# Patient Record
Sex: Female | Born: 1945 | Race: Black or African American | Hispanic: No | Marital: Married | Smoking: Never smoker
Health system: Southern US, Community
[De-identification: ages and names within clinical notes are randomized; demographics above are authoritative.]

## PROBLEM LIST (undated history)

## (undated) DIAGNOSIS — R339 Retention of urine, unspecified: Secondary | ICD-10-CM

## (undated) DIAGNOSIS — G8194 Hemiplegia, unspecified affecting left nondominant side: Secondary | ICD-10-CM

## (undated) DIAGNOSIS — I10 Essential (primary) hypertension: Secondary | ICD-10-CM

## (undated) DIAGNOSIS — I639 Cerebral infarction, unspecified: Secondary | ICD-10-CM

## (undated) DIAGNOSIS — E785 Hyperlipidemia, unspecified: Secondary | ICD-10-CM

## (undated) DIAGNOSIS — F329 Major depressive disorder, single episode, unspecified: Secondary | ICD-10-CM

## (undated) DIAGNOSIS — F32A Depression, unspecified: Secondary | ICD-10-CM

## (undated) HISTORY — DX: Essential (primary) hypertension: I10

## (undated) HISTORY — DX: Retention of urine, unspecified: R33.9

## (undated) HISTORY — DX: Cerebral infarction, unspecified: I63.9

## (undated) HISTORY — DX: Hyperlipidemia, unspecified: E78.5

## (undated) HISTORY — DX: Hemiplegia, unspecified affecting left nondominant side: G81.94

## (undated) HISTORY — DX: Major depressive disorder, single episode, unspecified: F32.9

## (undated) HISTORY — DX: Depression, unspecified: F32.A

---

## 2018-05-22 ENCOUNTER — Ambulatory Visit (INDEPENDENT_AMBULATORY_CARE_PROVIDER_SITE_OTHER): Payer: Medicare Other | Admitting: Neurology

## 2018-05-22 ENCOUNTER — Encounter: Payer: Self-pay | Admitting: Neurology

## 2018-05-22 ENCOUNTER — Telehealth: Payer: Self-pay

## 2018-05-22 VITALS — BP 150/95 | HR 72

## 2018-05-22 DIAGNOSIS — G8110 Spastic hemiplegia affecting unspecified side: Secondary | ICD-10-CM | POA: Diagnosis not present

## 2018-05-22 DIAGNOSIS — I63511 Cerebral infarction due to unspecified occlusion or stenosis of right middle cerebral artery: Secondary | ICD-10-CM | POA: Diagnosis not present

## 2018-05-22 NOTE — Telephone Encounter (Signed)
Request for medical release fax to [facility]  in [place] MD [phone]. THis was to medical records. DR.SEthi needs copy of disc of all scans. Needs all admission, and discharge notes, and labs and medications for him to review from [facility] in [place].

## 2018-05-22 NOTE — Patient Instructions (Signed)
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  Unfortunately I do not have records from [facility] in [place] today and we will send for these records before I decide on any further stroke work-up.  She will benefit with referral to neuro rehabilitation clinic for ongoing spasticity and therapy management.  Continue aspirin for stroke prevention and strict control of hypertension with blood pressure goal below 130/90 and lipids with LDL cholesterol goal below 70 mg percent.  Continue ongoing physical occupational and speech therapy.  She will return for follow-up in 2 months or call earlier if necessary.   Stroke Prevention Some medical conditions and behaviors are associated with a higher chance of having a stroke. You can help prevent a stroke by making nutrition, lifestyle, and other changes, including managing any medical conditions you may have. What nutrition changes can be made?  Eat healthy foods. You can do this by: ? Choosing foods high in fiber, such as fresh fruits and vegetables and whole grains. ? Eating at least 5 or more servings of fruits and vegetables a day. Try to fill half of your plate at each meal with fruits and vegetables. ? Choosing lean protein foods, such as lean cuts of meat, poultry without skin, fish, tofu, beans, and nuts. ? Eating low-fat dairy products. ? Avoiding foods that are high in salt (sodium). This can help lower blood pressure. ? Avoiding foods that have saturated fat, trans fat, and cholesterol. This can help prevent high cholesterol. ? Avoiding processed and premade foods.  Follow your health care provider's specific guidelines for losing weight, controlling high blood pressure (hypertension), lowering high cholesterol, and managing diabetes. These may include: ? Reducing your  daily calorie intake. ? Limiting your daily sodium intake to 1,500 milligrams (mg). ? Using only healthy fats for cooking, such as olive oil, canola oil, or sunflower oil. ? Counting your daily carbohydrate intake. What lifestyle changes can be made?  Maintain a healthy weight. Talk to your health care provider about your ideal weight.  Get at least 30 minutes of moderate physical activity at least 5 days a week. Moderate activity includes brisk walking, biking, and swimming.  Do not use any products that contain nicotine or tobacco, such as cigarettes and e-cigarettes. If you need help quitting, ask your health care provider. It may also be helpful to avoid exposure to secondhand smoke.  Limit alcohol intake to no more than 1 drink a day for nonpregnant women and 2 drinks a day for men. One drink equals 12 oz of beer, 5 oz of wine, or 1 oz of hard liquor.  Stop any illegal drug use.  Avoid taking birth control pills. Talk to your health care provider about the risks of taking birth control pills if: ? You are over 48 years old. ? You smoke. ? You get migraines. ? You have ever had a blood clot. What other changes can be made?  Manage your cholesterol levels. ? Eating a healthy diet is important for preventing high cholesterol. If cholesterol cannot be managed through diet alone, you may also need to take medicines. ? Take any prescribed medicines to control your cholesterol as told by your health care provider.  Manage your diabetes. ? Eating a healthy diet and exercising regularly are important parts of managing your blood sugar. If your blood  sugar cannot be managed through diet and exercise, you may need to take medicines. ? Take any prescribed medicines to control your diabetes as told by your health care provider.  Control your hypertension. ? To reduce your risk of stroke, try to keep your blood pressure below 130/80. ? Eating a healthy diet and exercising regularly are an  important part of controlling your blood pressure. If your blood pressure cannot be managed through diet and exercise, you may need to take medicines. ? Take any prescribed medicines to control hypertension as told by your health care provider. ? Ask your health care provider if you should monitor your blood pressure at home. ? Have your blood pressure checked every year, even if your blood pressure is normal. Blood pressure increases with age and some medical conditions.  Get evaluated for sleep disorders (sleep apnea). Talk to your health care provider about getting a sleep evaluation if you snore a lot or have excessive sleepiness.  Take over-the-counter and prescription medicines only as told by your health care provider. Aspirin or blood thinners (antiplatelets or anticoagulants) may be recommended to reduce your risk of forming blood clots that can lead to stroke.  Make sure that any other medical conditions you have, such as atrial fibrillation or atherosclerosis, are managed. What are the warning signs of a stroke? The warning signs of a stroke can be easily remembered as BEFAST.  B is for balance. Signs include: ? Dizziness. ? Loss of balance or coordination. ? Sudden trouble walking.  E is for eyes. Signs include: ? A sudden change in vision. ? Trouble seeing.  F is for face. Signs include: ? Sudden weakness or numbness of the face. ? The face or eyelid drooping to one side.  A is for arms. Signs include: ? Sudden weakness or numbness of the arm, usually on one side of the body.  S is for speech. Signs include: ? Trouble speaking (aphasia). ? Trouble understanding.  T is for time. ? These symptoms may represent a serious problem that is an emergency. Do not wait to see if the symptoms will go away. Get medical help right away. Call your local emergency services (911 in the [place] Do not drive yourself to the hospital.  Other signs of stroke may include: ? A sudden,  severe headache with no known cause. ? Nausea or vomiting. ? Seizure.  Where to find more information: For more information, visit:  American Stroke Association: www.strokeassociation.org  National Stroke Association: www.stroke.org  Summary  You can prevent a stroke by eating healthy, exercising, not smoking, limiting alcohol intake, and managing any medical conditions you may have.  Do not use any products that contain nicotine or tobacco, such as cigarettes and e-cigarettes. If you need help quitting, ask your health care provider. It may also be helpful to avoid exposure to secondhand smoke.  Remember BEFAST for warning signs of stroke. Get help right away if you or a loved one has any of these signs. This information is not intended to replace advice given to you by your health care provider. Make sure you discuss any questions you have with your health care provider. Document Released: 09/09/2004 Document Revised: 09/07/2016 Document Reviewed: 09/07/2016 Elsevier Interactive Patient Education  [employer].

## 2018-05-22 NOTE — Progress Notes (Signed)
[facility] [address] [place]. [place] [address] [phone]       OFFICE CONSULT NOTE  [name] Date of Birth:  [birth year] Medical Record Number:  [id number]   Referring MD: Virgel Bouquet  Reason for Referral: Stroke  HPI: [name] is a pleasant 72 year old African-American lady seen today for initial office consultation visit for stroke.  She is accompanied by her daughter.  History is obtained from them as well as review of provided medical records and I also spoke to her husband over the phone.  She was admitted to [facility] in [place] on 02/23/2018 with left-sided weakness.  She apparently presented beyond time window for thrombolysis but was found to have a right middle cerebral artery clot for which she underwent mechanical thrombectomy and intra-arterial treatment with 2 mg of TPA but with resultant persistent occlusion of the right parietal M2 branch and hemorrhagic transformation.Marland Kitchen  However as per the family the vessel reoccluded shortly thereafter with some hemorrhagic transformation in her right brain infarct with resultant significant left hemiplegia which has persisted.  Unfortunately I do not have either the imaging films or medical records from [facility] for my review today.  The patient was initially in inpatient rehab for 3 to 4 weeks and was transferred on 03/28/2018 to [facility] where she is currently getting physical occupational speech therapy.  However she remains completely paralyzed on the left and is yet unable to walk.  She can stand with 2 person support.  She still has no usable movement on the left side.  She did have significant left hemineglect initially which apparently has improved somewhat.  She still has diminished sensation on the left side.  She is on aspirin 81 mg daily as well as Lipitor which is tolerating well without side effects.  She is on hydrochlorothiazide 12.5 mg daily but blood pressure  today slightly elevated in the office.  She is also on baclofen for spasticity.  She has not been evaluated in the outpatient rehab clinic.  She has no prior history of strokes TIAs or significant neurological problems.  The family tells me that transthoracic echo and some cardiac monitoring during the hospitalization are done but no further outpatient cardiac monitoring or transesophageal echocardiogram were done.  ROS:   14 system review of systems is positive for weakness, gait difficulty, neglect on the left side and all other systems negative  PMH:  Past Medical History:  Diagnosis Date  . Depression   . HLD (hyperlipidemia)   . Hypertension   . Left hemiparesis ([place])   . Stroke ([place])   . Urinary retention     Social History:  Social History   Socioeconomic History  . Marital status: Married    Spouse name: Not on file  . Number of children: Not on file  . Years of education: Not on file  . Highest education level: Not on file  Occupational History  . Not on file  Social Needs  . Financial resource strain: Not on file  . Food insecurity:    Worry: Not on file    Inability: Not on file  . Transportation needs:    Medical: Not on file    Non-medical: Not on file  Tobacco Use  . Smoking status: Never Smoker  . Smokeless tobacco: Never Used  Substance and Sexual Activity  . Alcohol use: Not Currently  . Drug use: Not Currently  . Sexual activity: Not on file  Lifestyle  . Physical  activity:    Days per week: Not on file    Minutes per session: Not on file  . Stress: Not on file  Relationships  . Social connections:    Talks on phone: Not on file    Gets together: Not on file    Attends religious service: Not on file    Active member of club or organization: Not on file    Attends meetings of clubs or organizations: Not on file    Relationship status: Not on file  . Intimate partner violence:    Fear of current or ex partner: Not on file    Emotionally abused:  Not on file    Physically abused: Not on file    Forced sexual activity: Not on file  Other Topics Concern  . Not on file  Social History Narrative  . Not on file    Medications:   Current Outpatient Medications on File Prior to Visit  Medication Sig Dispense Refill  . acetaminophen (TYLENOL) 325 MG tablet Take 650 mg by mouth every 6 (six) hours as needed.    . ASPIRIN 81 PO Take 81 mg by mouth daily.    Marland Kitchen atorvastatin (LIPITOR) 40 MG tablet Take 40 mg by mouth daily at 6 PM.    . baclofen (LIORESAL) 10 MG tablet Take 10 mg by mouth 3 (three) times daily.    . bisacodyl (DULCOLAX) 10 MG suppository Place 10 mg rectally as needed for moderate constipation.    . docusate sodium (COLACE) 100 MG capsule Take 100 mg by mouth 2 (two) times daily.    Marland Kitchen enoxaparin (LOVENOX) 40 MG/0.4ML injection Inject 40 mg into the skin daily.    Marland Kitchen FLUoxetine (PROZAC) 10 MG capsule Take 10 mg by mouth daily.    . hydrochlorothiazide (MICROZIDE) 12.5 MG capsule Take 12.5 mg by mouth daily.    . hydroxypropyl methylcellulose / hypromellose (ISOPTO TEARS / GONIOVISC) 2.5 % ophthalmic solution 1 drop.    . potassium chloride (K-DUR) 10 MEQ tablet Take 20 mEq by mouth daily.    . SENNA PO Take by mouth 2 (two) times daily.    . sodium chloride (OCEAN) 0.65 % nasal spray Place 1 spray into the nose as needed for congestion.     No current facility-administered medications on file prior to visit.     Allergies:  No Known Allergies  Physical Exam General: Frail elderly African-American lady seated, in no evident distress Head: head normocephalic and atraumatic.   Neck: supple with no carotid or supraclavicular bruits Cardiovascular: regular rate and rhythm, no murmurs Musculoskeletal: no deformity Skin:  no rash/petichiae Vascular:  Normal pulses all extremities  Neurologic Exam Mental Status: Awake and fully alert. Oriented to place and time. Recent and remote memory intact. Attention span, concentration  and fund of knowledge appropriate. Mood and affect appropriate.  Cranial Nerves: Fundoscopic exam reveals sharp disc margins. Pupils equal, briskly reactive to light. Extraocular movements full without nystagmus. Visual fields show dense left homonymous hemianopsia  to confrontation. Hearing intact. Facial sensation intact.  Moderate left lower facial weakness., tongue, palate moves normally and symmetrically.  Motor: Spastic left hemiplegia with 0/5 strength in the left upper and lower extremity with increased tone and mild spasticity.  Leg greater than arm.  Normal strength on the right side.  There is s Sensory.:  Diminished to touch , pinprick , position and vibratory sensation.  In the left upper and lower extremities. Coordination: Rapid alternating movements normal in the  right-sided extremities. Finger-to-nose and heel-to-shin performed accurately on the right only and cannot be tested on the left. Gait and Station: Unable to arise from the bed and she is bedridden.   Reflexes: 1+ and asymmetric and brisker on the left. Toes downgoing.   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  Vascular risk factors of hypertension hyperlipidemia.     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  Unfortunately I do not have records from [facility] in [place] today and we will send for these records before I decide on any further stroke work-up.  She will benefit with referral to neuro rehabilitation clinic for ongoing spasticity and therapy management.  Continue aspirin for stroke prevention and  strict control of hypertension with blood pressure goal below 130/90 and lipids with LDL cholesterol goal below 70 mg percent.  Continue ongoing physical occupational and speech therapy. I spent 45  minutes in total face-to-face time with the patient, more than 50% of which was spent in counseling and coordination of care, reviewing test results, reviewing medication and discussing or reviewing the diagnosis of  Stroke and spastic hemiplegia  , the prognosis and treatment options.  She will return for follow-up in 2 months or call earlier if necessary. Antony Contras, MD Medical Director [facility] Pager: [phone] 05/22/2018 12:42 PM Note: This document was prepared with digital dictation and possible smart phrase technology. Any transcriptional errors that result from this process are unintentional.

## 2018-05-24 NOTE — Telephone Encounter (Signed)
LEft vm for medical records at [facility]  On [phone] for call back. Rn ask about the release form that was sent to medical records for her hospital stay in [place].

## 2018-05-25 ENCOUNTER — Telehealth: Payer: Self-pay | Admitting: Neurology

## 2018-05-25 NOTE — Telephone Encounter (Signed)
I received in the mail extensive medical records for this patient from a hospitalization at [facility] in [place] for her stroke in July 2019.  The records included discharge summary as well as imaging films on a disc.  I personally reviewed CT scan of the brain, CT angiogram of the brain and neck and CT perfusion from 02/23/2018 as well as MR angiogram of the brain, MRA of the neck and MRI scan of the brain from 02/24/2018.  I agree with the radiological interpretations provided.  Review of records and imaging studies does not change my plan as stated in my recent office consultation note from 05/22/2018.

## 2018-05-26 NOTE — Telephone Encounter (Signed)
Patients hospital notes form [facility] sent to medical records along with disc.

## 2018-06-15 ENCOUNTER — Ambulatory Visit: Payer: Medicare Other | Admitting: Physical Medicine & Rehabilitation

## 2018-06-22 ENCOUNTER — Telehealth: Payer: Self-pay | Admitting: Neurology

## 2018-06-22 NOTE — Telephone Encounter (Signed)
Dr. Leonie Man please advise thanks.

## 2018-06-22 NOTE — Telephone Encounter (Signed)
RN spoke with husband on dpr. Rn gave husband the new appt on 07/18/2018 at 1100am. The husband said his wife is fyling out on 07/23/2018 and will be going to [place].The husband stated pt will be there for two weeks. He is getting some more expert advice about advance therapy, and brain stimulation.The husband stated he found the place on his own, and applied for his wife. He states pt is still in wheelchair, and is getting lovenox injections daily. Rn stated a message a will be sent to Dr. Leonie Man.

## 2018-06-22 NOTE — Telephone Encounter (Signed)
Ok to fly and keep appointment to see me

## 2018-06-22 NOTE — Telephone Encounter (Signed)
Pts husband Michael(on DPR) requesting a call stating the pt had her stroke back on 7/11. Wanting to know if it is safe for her to fly on 12/8 please advise

## 2018-06-22 NOTE — Telephone Encounter (Signed)
RN call patients husband Micheal to state per DR.SEthi he reviewed the chart, and phone. PT can fly but keep appt with him on 07/18/2018. The husband verbalized understanding.

## 2018-07-06 ENCOUNTER — Encounter (HOSPITAL_COMMUNITY): Payer: Self-pay

## 2018-07-06 ENCOUNTER — Emergency Department (HOSPITAL_COMMUNITY): Payer: Medicare Other

## 2018-07-06 ENCOUNTER — Other Ambulatory Visit: Payer: Self-pay

## 2018-07-06 ENCOUNTER — Observation Stay (HOSPITAL_COMMUNITY)
Admission: EM | Admit: 2018-07-06 | Discharge: 2018-07-07 | Disposition: A | Discharge status: Skilled Nursing Facility | Payer: Medicare Other | Attending: Internal Medicine | Admitting: Internal Medicine

## 2018-07-06 DIAGNOSIS — R55 Syncope and collapse: Secondary | ICD-10-CM | POA: Diagnosis not present

## 2018-07-06 DIAGNOSIS — I771 Stricture of artery: Secondary | ICD-10-CM | POA: Diagnosis not present

## 2018-07-06 DIAGNOSIS — Z993 Dependence on wheelchair: Secondary | ICD-10-CM | POA: Insufficient documentation

## 2018-07-06 DIAGNOSIS — I69398 Other sequelae of cerebral infarction: Secondary | ICD-10-CM | POA: Diagnosis not present

## 2018-07-06 DIAGNOSIS — I10 Essential (primary) hypertension: Secondary | ICD-10-CM | POA: Diagnosis not present

## 2018-07-06 DIAGNOSIS — N390 Urinary tract infection, site not specified: Secondary | ICD-10-CM | POA: Diagnosis not present

## 2018-07-06 DIAGNOSIS — G9389 Other specified disorders of brain: Secondary | ICD-10-CM | POA: Diagnosis not present

## 2018-07-06 DIAGNOSIS — E785 Hyperlipidemia, unspecified: Secondary | ICD-10-CM | POA: Diagnosis not present

## 2018-07-06 DIAGNOSIS — I69392 Facial weakness following cerebral infarction: Secondary | ICD-10-CM | POA: Insufficient documentation

## 2018-07-06 DIAGNOSIS — I69354 Hemiplegia and hemiparesis following cerebral infarction affecting left non-dominant side: Secondary | ICD-10-CM | POA: Insufficient documentation

## 2018-07-06 DIAGNOSIS — H5347 Heteronymous bilateral field defects: Secondary | ICD-10-CM | POA: Insufficient documentation

## 2018-07-06 DIAGNOSIS — I69322 Dysarthria following cerebral infarction: Secondary | ICD-10-CM | POA: Diagnosis not present

## 2018-07-06 DIAGNOSIS — Z79899 Other long term (current) drug therapy: Secondary | ICD-10-CM | POA: Insufficient documentation

## 2018-07-06 LAB — BASIC METABOLIC PANEL
Anion gap: 9 (ref 5–15)
BUN: 15 mg/dL (ref 8–23)
CHLORIDE: 105 mmol/L (ref 98–111)
CO2: 26 mmol/L (ref 22–32)
CREATININE: 0.83 mg/dL (ref 0.44–1.00)
Calcium: 10 mg/dL (ref 8.9–10.3)
GFR calc Af Amer: >60 mL/min (ref >60)
GFR calc non Af Amer: >60 mL/min (ref >60)
Glucose, Bld: 130 mg/dL — ABNORMAL HIGH (ref 70–99)
Potassium: 4.3 mmol/L (ref 3.5–5.1)
SODIUM: 140 mmol/L (ref 135–145)

## 2018-07-06 LAB — CBC WITH DIFFERENTIAL/PLATELET
Abs Immature Granulocytes: 0.01 10*3/uL (ref 0.00–0.07)
Basophils Absolute: 0 10*3/uL (ref 0.0–0.1)
Basophils Relative: 1 %
EOS PCT: 0 %
Eosinophils Absolute: 0 10*3/uL (ref 0.0–0.5)
HEMATOCRIT: 45.3 % (ref 36.0–46.0)
HEMOGLOBIN: 14 g/dL (ref 12.0–15.0)
Immature Granulocytes: 0 %
Lymphocytes Relative: 22 %
Lymphs Abs: 1.1 10*3/uL (ref 0.7–4.0)
MCH: 26.5 pg (ref 26.0–34.0)
MCHC: 30.9 g/dL (ref 30.0–36.0)
MCV: 85.6 fL (ref 80.0–100.0)
MONO ABS: 0.4 10*3/uL (ref 0.1–1.0)
Monocytes Relative: 7 %
Neutro Abs: 3.4 10*3/uL (ref 1.7–7.7)
Neutrophils Relative %: 70 %
Platelets: 236 10*3/uL (ref 150–400)
RBC: 5.29 MIL/uL — AB (ref 3.87–5.11)
RDW: 13.2 % (ref 11.5–15.5)
WBC: 4.9 10*3/uL (ref 4.0–10.5)
nRBC: 0 % (ref 0.0–0.2)

## 2018-07-06 LAB — URINALYSIS, ROUTINE W REFLEX MICROSCOPIC
Bilirubin Urine: NEGATIVE
Glucose, UA: NEGATIVE mg/dL
Hgb urine dipstick: NEGATIVE
Ketones, ur: 5 mg/dL — AB
Nitrite: POSITIVE — AB
Protein, ur: NEGATIVE mg/dL
SPECIFIC GRAVITY, URINE: 1.023 (ref 1.005–1.030)
pH: 5 (ref 5.0–8.0)

## 2018-07-06 LAB — CBG MONITORING, ED: GLUCOSE-CAPILLARY: 136 mg/dL — AB (ref 70–99)

## 2018-07-06 LAB — TROPONIN I

## 2018-07-06 MED ORDER — IOPAMIDOL (ISOVUE-370) INJECTION 76%
INTRAVENOUS | Status: AC
  Administered 2018-07-06: 100 mL
  Filled 2018-07-06: qty 100

## 2018-07-06 MED ORDER — CEPHALEXIN 250 MG PO CAPS
500.0000 mg | ORAL_CAPSULE | Freq: Once | ORAL | Status: AC
  Administered 2018-07-06: 500 mg via ORAL
  Filled 2018-07-06: qty 2

## 2018-07-06 MED ORDER — IOHEXOL 300 MG/ML  SOLN: 100.0000 mL | Freq: Once | INTRAMUSCULAR | Status: DC | PRN

## 2018-07-06 NOTE — ED Notes (Signed)
Pt states she does not have a pacemaker

## 2018-07-06 NOTE — ED Notes (Signed)
Patient transported to CT 

## 2018-07-06 NOTE — ED Triage Notes (Signed)
Pt arrives to ED from [facility] with complaints of syncopal episode lasting approx 2 mins,  since this afternoon while in physical therapy. EMS reports pt has hx CVA with left sided deficits, pt is alert and oriented. VSS, CBG 175. Pt placed in position of comfort with bed locked and lowered, call bell in reach.

## 2018-07-06 NOTE — ED Provider Notes (Signed)
[facility] EMERGENCY DEPARTMENT Provider Note   CSN: [id number] Arrival date & time: 07/06/18  1649     History   Chief Complaint Chief Complaint  Patient presents with  . Loss of Consciousness    HPI Ann Figueroa is a 72 y.o. female with a PMH of Stroke in July 2019 presents with syncope onset 1pm today during physical therapy. PT reports patient was sitting on her wheelchair when she began leaning to the left side and lost consciousness for 2 minutes. Patient reports her vision was dark during the episode. 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 Per neurology note, patient does have some left sided facial droop, but patient's daughter reports patient's left sided facial droop resolved prior to today. Patient takes Lovenox daily. Patient reports she ate breakfast today. Patient reports she had a BM prior to PT. Patient is wheelchair bound. Patient denies new numbness, weakness, or current vision changes. Patient denies chest pain. Patient denies any heart problems.   HPI  Past Medical History:  Diagnosis Date  . Depression   . HLD (hyperlipidemia)   . Hypertension   . Left hemiparesis ([place])   . Stroke ([place])   . Urinary retention     There are no active problems to display for this patient.   History reviewed. No pertinent surgical history.   OB History   None      Home Medications    Prior to Admission medications   Medication Sig Start Date End Date Taking? Authorizing Provider  acetaminophen (TYLENOL) 325 MG tablet Take 650 mg by mouth every 4 (four) hours as needed for mild pain.    Yes [provider]  ARTIFICIAL TEAR OP Place 1 drop into both eyes 4 (four) times daily.   Yes [provider]  ASPIRIN 81 PO Take 81 mg by mouth daily.   Yes [provider]  atorvastatin (LIPITOR) 40 MG tablet Take 40 mg by mouth  daily at 6 PM.   Yes [provider]  baclofen (LIORESAL) 10 MG tablet Take 10 mg by mouth at bedtime.    Yes [provider]  bisacodyl (DULCOLAX) 10 MG suppository Place 10 mg rectally as needed for moderate constipation.   Yes [provider]  diclofenac sodium (VOLTAREN) 1 % GEL Apply 2 g topically 3 (three) times daily.   Yes [provider]  docusate sodium (COLACE) 100 MG capsule Take 100 mg by mouth 2 (two) times daily.   Yes [provider]  enoxaparin (LOVENOX) 40 MG/0.4ML injection Inject 40 mg into the skin daily.   Yes [provider]  hydrochlorothiazide (MICROZIDE) 12.5 MG capsule Take 12.5 mg by mouth daily.   Yes [provider]  metoprolol tartrate (LOPRESSOR) 25 MG tablet Take 25 mg by mouth 2 (two) times daily.   Yes [provider]  potassium chloride (K-DUR,KLOR-CON) 10 MEQ tablet Take 20 mEq by mouth daily. 56meq am adding 89meq kcl every afternoon for total 60 meq daily   Yes [provider]  Potassium Chloride ER 20 MEQ TBCR Take 40 mEq by mouth daily.    Yes [provider]  SENNA PO Take 2 tablets by mouth 2 (two) times daily.    Yes [provider]  tiZANidine (ZANAFLEX) 2 MG tablet Take 2 mg by mouth every morning.   Yes [provider]  Family History History reviewed. No pertinent family history.  Social History Social History   Tobacco Use  . Smoking status: Never Smoker  . Smokeless tobacco: Never Used  Substance Use Topics  . Alcohol use: Not Currently  . Drug use: Not Currently     Allergies   Patient has no known allergies.   Review of Systems Review of Systems  Constitutional: Negative for chills, diaphoresis, fatigue, fever and unexpected weight change.  Eyes: Positive for visual disturbance.  Respiratory: Negative for cough and shortness of breath.   Cardiovascular: Negative for chest pain, palpitations and leg swelling.    Gastrointestinal: Negative for abdominal pain, blood in stool, diarrhea, nausea and vomiting.  Genitourinary: Negative for dysuria.  Skin: Negative for pallor.  Allergic/Immunologic: Negative for immunocompromised state.  Neurological: Positive for syncope. Negative for dizziness, seizures, speech difficulty, weakness, numbness and headaches.  Hematological: Does not bruise/bleed easily.  Psychiatric/Behavioral: The patient is not nervous/anxious.      Physical Exam Updated Vital Signs BP (!) 156/87   Pulse 69   Temp 98.5 F (36.9 C) (Oral)   Resp 17   Ht 5\' 5"  (1.651 m)   Wt 68 kg   SpO2 97%   BMI 24.96 kg/m   Physical Exam  Constitutional: She is oriented to person, place, and time. She appears well-developed and well-nourished. No distress.  HENT:  Head: Normocephalic and atraumatic.  Cardiovascular: Normal rate, regular rhythm and normal heart sounds. Exam reveals no gallop and no friction rub.  No murmur heard. Pulmonary/Chest: Effort normal and breath sounds normal. No respiratory distress. She has no wheezes. She has no rales.  Abdominal: Soft. She exhibits no distension. There is no tenderness. There is no guarding.  Musculoskeletal:  Patient has decreased ROM, sensation, and weakness on left arm and leg due to prior stroke in July 2019.   Neurological: She is alert and oriented to person, place, and time.  Skin: Skin is warm. No rash noted. She is not diaphoretic. No erythema.  Psychiatric: She has a normal mood and affect. Her speech is delayed. She is slowed. Cognition and memory are normal.  Nursing note and vitals reviewed.  Mental Status:  Alert, oriented, thought content appropriate, able to give a coherent history. Speech fluent without evidence of aphasia. Able to follow 2 step commands without difficulty.  Cranial Nerves:  II:  Peripheral visual fields grossly normal, pupils equal, round, reactive to light III,IV, VI: ptosis not present, extra-ocular  motions intact bilaterally  V,VII: smile is asymmetrical - patient has left sided facial droop, facial light touch sensation decreased on left side of face. Eyebrows rise symmetrically.  VIII: hearing grossly normal to voice  X: uvula elevates symmetrically  XI: bilateral shoulder shrug symmetric and strong XII: midline tongue extension without fassiculations Motor:  Normal tone. 5/5 in right upper and lower extremities including strong and equal grip strength and dorsiflexion/plantar flexion. Patient is not able to move left leg has limited strength of left arm/hand. Patient reports this is not a new symptom.  Sensory:  light touch normal in right upper and lower extremities. Light touch decreased in left upper and lower extremities. Patient reports this is not a new symptom.  Gait: patient is wheelchair bound.  CV: distal pulses palpable throughout     ED Treatments / Results  Labs (all labs ordered are listed, but only abnormal results are displayed) Labs Reviewed  BASIC METABOLIC PANEL - Abnormal; Notable for the following components:  Result Value   Glucose, Bld 130 (*)    All other components within normal limits  CBC WITH DIFFERENTIAL/PLATELET - Abnormal; Notable for the following components:   RBC 5.29 (*)    All other components within normal limits  URINALYSIS, ROUTINE W REFLEX MICROSCOPIC - Abnormal; Notable for the following components:   Color, Urine AMBER (*)    APPearance CLOUDY (*)    Ketones, ur 5 (*)    Nitrite POSITIVE (*)    Leukocytes, UA MODERATE (*)    Bacteria, UA MANY (*)    All other components within normal limits  CBG MONITORING, ED - Abnormal; Notable for the following components:   Glucose-Capillary 136 (*)    All other components within normal limits  URINE CULTURE  TROPONIN I    EKG None  Radiology Ct Angio Head W/cm &/or Wo Cm  Result Date: 07/06/2018 CLINICAL DATA:  Syncopal episode at physical therapy. Follow-up sellar lesion.  EXAM: CT ANGIOGRAPHY HEAD TECHNIQUE: Multidetector CT imaging of the head was performed using the standard protocol during bolus administration of intravenous contrast. Multiplanar CT image reconstructions and MIPs were obtained to evaluate the vascular anatomy. CONTRAST:  100 cc ISOVUE-370 IOPAMIDOL (ISOVUE-370) INJECTION 76% COMPARISON:  CT HEAD July 06, 2018 FINDINGS: ANTERIOR CIRCULATION: Moderate calcific atherosclerosis carotid siphon patent cervical internal carotid arteries, petrous, cavernous and supra clinoid internal carotid arteries. Severe RIGHT and moderate LEFT paraclinoid ICA stenosis. Patent anterior communicating artery. Severe stenosis LEFT proximal M2 segments. 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 Moderate luminal irregularity anterior middle cerebral arteries compatible with atherosclerosis. No contrast extravasation or aneurysm. POSTERIOR CIRCULATION: Patent vertebral arteries, vertebrobasilar junction and basilar artery, as well as main branch vessels. Patent posterior cerebral arteries, moderate luminal irregularity compatible with atherosclerosis No large vessel occlusion, significant stenosis, contrast extravasation or aneurysm. VENOUS SINUSES: Major dural venous sinuses are patent though not tailored for evaluation on this angiographic examination. ANATOMIC VARIANTS: None. DELAYED PHASE: No arterial contrast opacification of 1 cm sellar mass, however homogeneous enhancement on delayed phase, displacement of the infundibulum to the RIGHT. MIP images reviewed. IMPRESSION: 1. 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 2. Severe stenosis RIGHT supraclinoid ICA.  Severe LEFT M2 stenoses. 3. Moderate cerebral artery atherosclerosis. 4. No aneurysm. 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 Recommend non emergent MRI pituitary protocol. Electronically Signed   By: Elon Alas M.D.   On: 07/06/2018 21:45   Dg  Chest 2 View  Result Date: 07/06/2018 CLINICAL DATA:  Syncopal episode today. EXAM: CHEST - 2 VIEW COMPARISON:  None. FINDINGS: The heart size is normal. Aortic arch is unremarkable. Right paratracheal soft tissue is noted. There is prominent soft tissue on the lateral view is well. Lungs are clear. There is no edema or effusion. Mild degenerative changes are noted at both shoulders. IMPRESSION: 1. Right paraspinous soft tissue raises possibility of mediastinal mass. Recommend CT of the chest with contrast for further evaluation. These results were called by telephone at the time of interpretation on 07/06/2018 at 7:34 pm to Dr. Alvino Chapel , who verbally acknowledged these results. Electronically Signed   By: San Morelle M.D.   On: 07/06/2018 19:34   Ct Head Wo Contrast  Result Date: 07/06/2018 CLINICAL DATA:  Syncopal episode at physical therapy. History of stroke, hypertension and hyperlipidemia. EXAM: CT HEAD WITHOUT CONTRAST TECHNIQUE: Contiguous axial images were obtained from the base of the skull through the vertex without intravenous contrast. COMPARISON:  None. FINDINGS: BRAIN:  No intraparenchymal hemorrhage, mass effect nor midline shift. Confluent RIGHT frontotemporal parietal encephalomalacia with ex vacuo dilatation RIGHT lateral ventricle, minimal petechial hemorrhage versus mineralization old RIGHT basal ganglia infarcts. Hypodense RIGHT thalamus (series 3, image 17). Patchy supratentorial white matter hypodensities exclusive aforementioned abnormality. No abnormal extra-axial fluid collections. Basal cisterns are patent. 12 x 13 x 13 mm soft tissue sellar mass resulting in dehiscent planum sphenoidale, mass is contiguous with LEFT medial cavernous sinus with partial dorsal calcification. VASCULAR: Moderate calcific atherosclerosis of the carotid siphons. SKULL: No skull fracture. No significant scalp soft tissue swelling. SINUSES/ORBITS: Large LEFT maxillary mucosal retention cyst.  Mastoid air cells are well aerated.The included ocular globes and orbital contents are non-suspicious. Status post bilateral ocular lens implants. OTHER: None. IMPRESSION: 1. Age-indeterminate RIGHT thalamus nonhemorrhagic infarct. 2. Subacute old large RIGHT MCA and posterior watershed territory infarcts. 3. 12 x 13 x 13 mm eccentric sellar mass, atypical appearance for macroadenoma. This could reflect aneurysm and CTA versus MRA head recommended. 4. Moderate chronic small vessel ischemic changes. Electronically Signed   By: Elon Alas M.D.   On: 07/06/2018 19:36   Ct Chest W Contrast  Result Date: 07/06/2018 CLINICAL DATA:  Syncope.  Abnormal chest x-ray. EXAM: CT CHEST WITH CONTRAST TECHNIQUE: Multidetector CT imaging of the chest was performed during intravenous contrast administration. CONTRAST:  <See Chart> ISOVUE-370 IOPAMIDOL (ISOVUE-370) INJECTION 76% COMPARISON:  None. FINDINGS: Cardiovascular: Coronary artery calcifications are identified. Mild cardiomegaly. The thoracic aorta is tortuous. Proximal thoracic aortic arch measures 3.7 cm on series 3, image 48. No dissection. Atherosclerotic change seen in the aorta. The branching vessels are unremarkable. The increased soft tissue along the right lateral mediastinum on the recent x-ray is noted to represent vascular structures on this CT scan consisting of the SVC in the tortuous aorta. The main pulmonary artery is normal. Mediastinum/Nodes: There is enlargement of the left thyroid lobe consistent with a goiter. The esophagus is normal. No effusions. The chest wall is normal. No adenopathy. Lungs/Pleura: Lungs are clear. No pleural effusion or pneumothorax. Upper Abdomen: No acute abnormality. Musculoskeletal: No chest wall abnormality. No acute or significant osseous findings. IMPRESSION: 1. The proximal thoracic aortic arch measures 3.7 cm. Recommend annual imaging followup by CTA or MRA. This recommendation follows 2010  ACCF/AHA/AATS/ACR/ASA/SCA/SCAI/SIR/STS/SVM Guidelines for the Diagnosis and Management of Patients with Thoracic Aortic Disease. Circulation.2010; 121: T701-X793 2. Coronary artery calcifications. Atherosclerotic change in the thoracic aorta. 3. No mediastinal mass. 4. Left thyroid lobe enlargement/goiter. Aortic aneurysm NOS (ICD10-I71.9). Aortic Atherosclerosis (ICD10-I70.0). Electronically Signed   By: Dorise Bullion III M.D   On: 07/06/2018 21:59    Procedures Procedures (including critical care time)  Medications Ordered in ED Medications  iohexol (OMNIPAQUE) 300 MG/ML solution 100 mL (has no administration in time range)  iopamidol (ISOVUE-370) 76 % injection (100 mLs  Contrast Given 07/06/18 2104)  cephALEXin (KEFLEX) capsule 500 mg (500 mg Oral Given 07/06/18 2314)     Initial Impression / Assessment and Plan / ED Course  I have reviewed the triage vital signs and the nursing notes.  Pertinent labs & imaging results that were available during my care of the patient were reviewed by me and considered in my medical decision making (see chart for details).  Clinical Course as of Jul 07 2  Thu Jul 06, 2018  1741 DG Chest 2 View [AH]  1742 DG Chest 2 View [AH]  1935 Positive nitrites, leukocytes, and many bacteria consistent with a UTI.   Urinalysis, Routine w  reflex microscopic(!) [AH]  2007 Right paraspinous soft tissue raises possibility of mediastinal mass. Ordered CT chest with contrast for further evaluation.     DG Chest 2 View [AH]  2008 Age-indeterminate RIGHT thalamus nonhemorrhagic infarct. Subacute old large RIGHT MCA and posterior watershed territory Infarcts. 12 x 13 x 13 mm eccentric sellar mass, atypical appearance for macroadenoma. Since this could reflect aneurysm, radiology recommends CTA versus MRA head. Will discuss this decision with Dr. Alvino Chapel.    [AH]  2033 Will order CTA of head due to abnormal findings in CT head.   [AH]  2042 Discussed with  family the results of CT and CXR. Informed family that we will be requiring a CTA of head and a CT of chest. Family appears to understand.    [AH]  2159 Head CTA reveals chronically occluded RIGHT M1 segment with poor collateralization,  severe stenosis RIGHT supraclinoid ICA. severe LEFT M2 stenoses, and moderate cerebral artery atherosclerosis. Consulted neurology and discussed case. Neurology recommended and head MRI. Neurology stated that no emergent interventions are needed at this time.       [AH]    Clinical Course User Index [AH] Arville Lime, PA-C    Assessment/Plan: Patient presents with complaint of syncope. Ordered EKG and troponin to assess cardiac causes. EKG and troponin were negative. Ordered BMP and CBC to assess for electrolyte abnormalities, anemia, or signs of infection. CT head revealed an age indeterminate right thalamus non-hemorrhagic infarct and ordered a head CTA to further evaluate findings. Head CTA revealed a chronically occluded right M1 segment with poor collateralization, severe stenosis of right supraclinoid ICA, and severe left M2 stenoses. Discussed findings with neurology and a head MRI was recommended. Neurology reports no emergent treatment is required at this time. CXR reveals widen mediastinum, therefore, ordered CT of chest. CT of chest reveals proximal thoracic aortic arch measures 3.7 cm. Discussed findings with family and family understands recommendation of annual CTA or MRA of chest. UA reveals nitrites, leukocytosis, and bacteria consistent with UTI. Provided Keflex for UTI. Consulted hospitalist for admission due to syncope. Neurology recommended to admit to hospitalist if MRI reveals new stroke. If MRI does not reveal a new stroke, patient will be discharged on Keflex for UTI.   Findings and plan of care discussed with supervising physician Dr. Alvino Chapel.  At shift change care was transferred to [facility] who will follow pending studies,  re-evaluate and determine disposition. Discussed with provider to contact hospitalist with results of MRI to determine disposition.    Final Clinical Impressions(s) / ED Diagnoses   Final diagnoses:  Syncope, unspecified syncope type    ED Discharge Orders    None       Arville Lime, [place] 07/07/18 0044    Davonna Belling, MD 07/07/18 1535

## 2018-07-06 NOTE — ED Notes (Signed)
Pt is able to recall the event, states she was sitting in the wheelchair when the room started getting dark, pt did not fall, but slumped to the left in her wheelchair.

## 2018-07-07 ENCOUNTER — Observation Stay (HOSPITAL_COMMUNITY): Payer: Medicare Other

## 2018-07-07 DIAGNOSIS — R55 Syncope and collapse: Secondary | ICD-10-CM | POA: Diagnosis present

## 2018-07-07 MED ORDER — ACETAMINOPHEN 500 MG PO TABS
1000.0000 mg | ORAL_TABLET | Freq: Once | ORAL | Status: AC
  Administered 2018-07-07: 1000 mg via ORAL
  Filled 2018-07-07: qty 2

## 2018-07-07 MED ORDER — CEPHALEXIN 500 MG PO CAPS: 500.0000 mg | ORAL_CAPSULE | Freq: Two times a day (BID) | ORAL | 0 refills | Status: DC

## 2018-07-07 NOTE — ED Notes (Signed)
Patient transported to MRI 

## 2018-07-07 NOTE — ED Notes (Signed)
Patient verbalizes understanding of discharge instructions. Opportunity for questioning and answers were provided. Armband removed by staff, pt discharged from ED with PTAR.  

## 2018-07-07 NOTE — Consult Note (Addendum)
Neurology Consultation  Reason for Consult: Syncopal episode, concern for new stroke on head CT Referring Physician: Dr. Alvino Chapel  CC: Syncopal episode at rehab facility, concern for new stroke and a head CT on top of an old right MCA stroke  History is obtained from: Patient, daughter, chart  HPI: Ann Figueroa is a 72 y.o. 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 The patient says that she underwent physical therapy and sat in her chair and all of a sudden felt a lot of darkness in her vision and passed out for a minute or 2.  She came around quickly without any prolonged postictal phase.  She also has a history of urinary retention.  There was no urinary incontinence.  No noticed or witnessed seizure activity. She was evaluated in the emergency room with a CAT scan of the brain that showed encephalomalacia in the right MCA territory from the prior stroke and a age-indeterminate right thalamic stroke.  CT Angie of head and neck was also done that showed chronic right MCA occlusion and severe right supraclinoid ICA and severe left M2 stenoses. Neurology consultation was obtained for the need for stroke work-up and admission for stroke.   LKW: 12 PM on 07/06/2018 tpa given?: no, syncopal episode-symptoms resolved Premorbid modified Rankin scale (mRS): 4   ROS: ROS was performed and is negative except as noted in the HPI.   Past Medical History:  Diagnosis Date  . Depression   . HLD (hyperlipidemia)   . Hypertension   .  Left hemiparesis ([place])   . Stroke ([place])   . Urinary retention    History reviewed. No pertinent family history.   Social History:   reports that she has never smoked. She has never used smokeless tobacco. She reports that she drank alcohol. She reports that she has current or past drug history.  Medications  Current Facility-Administered Medications:  .  iohexol (OMNIPAQUE) 300 MG/ML solution 100 mL, 100 mL, Intravenous, Once PRN, Jerilee Hoh, Ana P, PA-C  Current Outpatient Medications:  .  acetaminophen (TYLENOL) 325 MG tablet, Take 650 mg by mouth every 4 (four) hours as needed for mild pain. , Disp: , Rfl:  .  ARTIFICIAL TEAR OP, Place 1 drop into both eyes 4 (four) times daily., Disp: , Rfl:  .  ASPIRIN 81 PO, Take 81 mg by mouth daily., Disp: , Rfl:  .  atorvastatin (LIPITOR) 40 MG tablet, Take 40 mg by mouth daily at 6 PM., Disp: , Rfl:  .  baclofen (LIORESAL) 10 MG tablet, Take 10 mg by mouth at bedtime. , Disp: , Rfl:  .  bisacodyl (DULCOLAX) 10 MG suppository, Place 10 mg rectally as needed for moderate constipation., Disp: , Rfl:  .  diclofenac sodium (VOLTAREN) 1 % GEL, Apply 2 g topically 3 (three) times daily., Disp: , Rfl:  .  docusate sodium (COLACE) 100 MG capsule, Take 100 mg by mouth 2 (two) times daily., Disp: , Rfl:  .  enoxaparin (LOVENOX) 40 MG/0.4ML injection, Inject 40 mg into the skin daily., Disp: , Rfl:  .  hydrochlorothiazide (MICROZIDE) 12.5 MG capsule, Take 12.5 mg  by mouth daily., Disp: , Rfl:  .  metoprolol tartrate (LOPRESSOR) 25 MG tablet, Take 25 mg by mouth 2 (two) times daily., Disp: , Rfl:  .  potassium chloride (K-DUR,KLOR-CON) 10 MEQ tablet, Take 20 mEq by mouth daily. 35meq am adding 14meq kcl every afternoon for total 60 meq daily, Disp: , Rfl:  .  Potassium Chloride ER 20 MEQ TBCR, Take 40 mEq by mouth daily. , Disp: , Rfl:  .  SENNA PO, Take 2 tablets by mouth 2 (two) times daily. , Disp: , Rfl:  .  tiZANidine (ZANAFLEX) 2 MG tablet, Take 2 mg  by mouth every morning., Disp: , Rfl:   Exam: Current vital signs: BP (!) 156/87   Pulse 69   Temp 98.5 F (36.9 C) (Oral)   Resp 17   Ht 5\' 5"  (1.651 m)   Wt 68 kg   SpO2 97%   BMI 24.96 kg/m  Vital signs in last 24 hours: Temp:  [98.5 F (36.9 C)] 98.5 F (36.9 C) (11/21 1651) Pulse Rate:  [51-70] 69 (11/21 2258) Resp:  [15-21] 17 (11/21 2258) BP: (116-156)/(61-102) 156/87 (11/21 2258) SpO2:  [97 %-100 %] 97 % (11/21 2258) Weight:  [68 kg] 68 kg (11/21 1652)  GENERAL: Awake, alert in NAD HEENT: - Normocephalic and atraumatic, dry mm, no LN++, no Thyromegally LUNGS - Clear to auscultation bilaterally with no wheezes CV - S1S2 RRR, no m/r/g, equal pulses bilaterally. ABDOMEN - Soft, nontender, nondistended with normoactive BS Ext: warm, well perfused, intact peripheral pulses, no edema but slight swelling on the left ankle.  NEURO:  Mental Status: AA&Ox3  Language: speech is dysarthric..  Naming, repetition, fluency, and comprehension intact. Cranial Nerves: PERRL EOMI, left homonymous hemianopsia, left lower facial weakness, facial sensation intact, hearing intact, tongue/uvula/soft palate midline, normal sternocleidomastoid and trapezius muscle strength. No evidence of tongue atrophy or fibrillations Motor: 0/5 left upper extremity and 1/5 left lower extremity with increased tone worse in the upper extremities and lower extremity.  Normal right-sided strength. Sensation-decreased to touch pinprick position and vibratory sense in the left upper and lower extremities.  Some extinction to the left and double cemetery stim elation. Coordination: FTN intact on the right, heel-knee-shin intact on the right-unable to perform ordination testing on the left due to hemiplegia. Gait- deferred DTRs: Brisk on the left, toes downgoing bilaterally.  NIHSS 1a Level of Conscious.: 0 1b LOC Questions: 0 1c LOC Commands: 0 2 Best Gaze: 0 3 Visual: 2 4 Facial Palsy: 2 5a Motor Arm -  left: 4 5b Motor Arm - Right: 0 6a Motor Leg - Left: 3 6b Motor Leg - Right: 0 7 Limb Ataxia: 0 8 Sensory: 1 9 Best Language: 0 10 Dysarthria: 1 11 Extinct. and Inatten.: 1 TOTAL: 14  Labs I have reviewed labs in epic and the results pertinent to this consultation are: CBC    Component Value Date/Time   WBC 4.9 07/06/2018 1721   RBC 5.29 (H) 07/06/2018 1721   HGB 14.0 07/06/2018 1721   HCT 45.3 07/06/2018 1721   PLT 236 07/06/2018 1721   MCV 85.6 07/06/2018 1721   MCH 26.5 07/06/2018 1721   MCHC 30.9 07/06/2018 1721   RDW 13.2 07/06/2018 1721   LYMPHSABS 1.1 07/06/2018 1721   MONOABS 0.4 07/06/2018 1721   EOSABS 0.0 07/06/2018 1721   BASOSABS 0.0 07/06/2018 1721   CMP     Component Value Date/Time   NA 140 07/06/2018 1721   K 4.3 07/06/2018 1721  CL 105 07/06/2018 1721   CO2 26 07/06/2018 1721   GLUCOSE 130 (H) 07/06/2018 1721   BUN 15 07/06/2018 1721   CREATININE 0.83 07/06/2018 1721   CALCIUM 10.0 07/06/2018 1721   GFRNONAA >60 07/06/2018 1721   GFRAA >60 07/06/2018 1721  UA reveals UTI.  Imaging I have reviewed the images obtained:  CT-scan of the brain-encephalomalacia in the right MCA territory with age-indeterminate right thalamic hypodensity. CTA head and neck- chronic right M1 occlusion, severe left M2 and supraclinoid right ICA stenosis. 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 Her CT scan of the head reveals encephalomalacia from the old stroke and a age-indeterminate right thalamic hypodensity and CT Angie of head and neck shows chronic right M1 occlusion,  severe stenosis in the left M2 and supraclinoid right ICA. Labs are remarkable for a significant UTI. Differentials at this time include: 1-syncopal episode in the setting of UTI 2- recrudescence of old stroke symptoms in the setting of UTI 3- evaluate for new stroke  Recommendations: MRI of the brain without contrast. If the MRI of the brain shows a new stroke-admit for stroke work-up. If the MRI of the brain is negative, antibiotics for UTI and discharged home.  She has an appointment with Dr. Leonie Man in December 2019 and she will follow that appointment. Outpatient PMD also to consider reducing sedating medications such as tizanidine.  -- Amie Portland, MD [facility] Pager: [phone] If 7pm to 7am, please call on call as listed on AMION.   ADDENDUM MRI negative for acute stroke. Recs as above

## 2018-07-07 NOTE — ED Provider Notes (Signed)
Patient signed out to me at shift change.  Patient with loss of consciousness and syncopal episode at physical therapy today.  Patient's work-up notable for UTI.  Patient has been seen by neurology, who believes symptoms are likely related to UTI.  No acute findings on MRI.  Dr. Hal Hope was also consulted, and will admit the patient pending MRI findings.  I spoke with Dr. Hal Hope after MRI head resulted, and he agrees with the plan established by Dr. Rory Percy from neurology, to treat the patient's UTI.  She is stable for discharge back to her facility.  I discussed the patient's test results with the patient and family, all are in agreement.   Montine Circle, PA-C 07/07/18 1290    Duffy Bruce, MD 07/08/18 1308

## 2018-07-08 LAB — URINE CULTURE: Culture: >=100000 — AB

## 2018-07-18 ENCOUNTER — Telehealth: Payer: Self-pay | Admitting: Neurology

## 2018-07-18 ENCOUNTER — Ambulatory Visit (INDEPENDENT_AMBULATORY_CARE_PROVIDER_SITE_OTHER): Payer: Medicare Other | Admitting: Neurology

## 2018-07-18 ENCOUNTER — Encounter: Payer: Self-pay | Admitting: Neurology

## 2018-07-18 VITALS — BP 93/65 | HR 59

## 2018-07-18 DIAGNOSIS — M25512 Pain in left shoulder: Secondary | ICD-10-CM | POA: Diagnosis not present

## 2018-07-18 DIAGNOSIS — G8929 Other chronic pain: Secondary | ICD-10-CM | POA: Diagnosis not present

## 2018-07-18 DIAGNOSIS — D352 Benign neoplasm of pituitary gland: Secondary | ICD-10-CM | POA: Diagnosis not present

## 2018-07-18 DIAGNOSIS — I63511 Cerebral infarction due to unspecified occlusion or stenosis of right middle cerebral artery: Secondary | ICD-10-CM | POA: Diagnosis not present

## 2018-07-18 NOTE — Telephone Encounter (Signed)
Medicare/aetna supp order sent to GI. No auth they will reach out to the pt to schedule.

## 2018-07-18 NOTE — Progress Notes (Signed)
[facility] [address] [place]. [place] [address] [phone]       OFFICE CONSULT NOTE  [name] Date of Birth:  [birth year] Medical Record Number:  [id number]   Referring MD: Virgel Bouquet  Reason for Referral: Stroke  HPI: Initial visit 05/22/2018 Ann Figueroa is a pleasant 72 year old African-American lady seen today for initial office consultation visit for stroke.  She is accompanied by her daughter.  History is obtained from them as well as review of provided medical records and I also spoke to her husband over the phone.  She was admitted to [facility] in [place] on 02/23/2018 with left-sided weakness.  She apparently presented beyond time window for thrombolysis but was found to have a right middle cerebral artery clot for which she underwent mechanical thrombectomy and intra-arterial treatment with 2 mg of TPA but with resultant persistent occlusion of the right parietal M2 branch and hemorrhagic transformation.Marland Kitchen  However as per the family the vessel reoccluded shortly thereafter with some hemorrhagic transformation in her right brain infarct with resultant significant left hemiplegia which has persisted.  Unfortunately I do not have either the imaging films or medical records from [facility] for my review today.  The patient was initially in inpatient rehab for 3 to 4 weeks and was transferred on 03/28/2018 to [facility] where she is currently getting physical occupational speech therapy.  However she remains completely paralyzed on the left and is yet unable to walk.  She can stand with 2 person support.  She still has no usable movement on the left side.  She did have significant left hemineglect initially which apparently has improved somewhat.  She still has diminished sensation on the left side.  She is on aspirin 81 mg daily as well as Lipitor which is tolerating well without side effects.  She is on hydrochlorothiazide 12.5 mg daily  but blood pressure today slightly elevated in the office.  She is also on baclofen for spasticity.  She has not been evaluated in the outpatient rehab clinic.  She has no prior history of strokes TIAs or significant neurological problems.  The family tells me that transthoracic echo and some cardiac monitoring during the hospitalization are done but no further outpatient cardiac monitoring or transesophageal echocardiogram were done. Update 07/18/2018 ; She returns for follow-up after last visit 2 months ago.  She is accompanied by her daughter.  Patient was seen in the emergency room on 07/07/2018 for episode of brief loss of consciousness felt to be a syncopal episode.  She was felt to be dehydrated.  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  Dedicated pituitary imaging was recommended but has not been done.  I had referred the patient to Dr. Letta Pate at  the last visit for shoulder pain and spasticity management but apparently that has not yet happened.  There are no other neurological changes. ROS:   14 system review of systems is positive for weakness, gait difficulty, left shoulder pain and all other systems negative  PMH:  Past Medical History:  Diagnosis Date  . Depression   . HLD (hyperlipidemia)   . Hypertension   . Left hemiparesis ([place])   . Stroke ([place])   . Urinary retention     Social History:  Social History   Socioeconomic History  . Marital status: Married    Spouse name: Not on file  . Number of children: Not on file  .  Years of education: Not on file  . Highest education level: Not on file  Occupational History  . Not on file  Social Needs  . Financial resource strain: Not on file  . Food insecurity:    Worry: Not on file    Inability: Not on file  . Transportation needs:    Medical: Not on file    Non-medical: Not on file  Tobacco Use  . Smoking status: Never Smoker  . Smokeless  tobacco: Never Used  Substance and Sexual Activity  . Alcohol use: Not Currently  . Drug use: Not Currently  . Sexual activity: Not Currently  Lifestyle  . Physical activity:    Days per week: Not on file    Minutes per session: Not on file  . Stress: Not on file  Relationships  . Social connections:    Talks on phone: Not on file    Gets together: Not on file    Attends religious service: Not on file    Active member of club or organization: Not on file    Attends meetings of clubs or organizations: Not on file    Relationship status: Not on file  . Intimate partner violence:    Fear of current or ex partner: Not on file    Emotionally abused: Not on file    Physically abused: Not on file    Forced sexual activity: Not on file  Other Topics Concern  . Not on file  Social History Narrative  . Not on file    Medications:   Current Outpatient Medications on File Prior to Visit  Medication Sig Dispense Refill  . acetaminophen (TYLENOL) 325 MG tablet Take 650 mg by mouth every 4 (four) hours as needed for mild pain.     Marland Kitchen ARTIFICIAL TEAR OP Place 1 drop into both eyes 4 (four) times daily.    . ASPIRIN 81 PO Take 81 mg by mouth daily.    Marland Kitchen atorvastatin (LIPITOR) 40 MG tablet Take 40 mg by mouth daily at 6 PM.    . baclofen (LIORESAL) 10 MG tablet Take 10 mg by mouth at bedtime.     . bisacodyl (DULCOLAX) 10 MG suppository Place 10 mg rectally as needed for moderate constipation.    . cephALEXin (KEFLEX) 500 MG capsule Take 1 capsule (500 mg total) by mouth 2 (two) times daily. 28 capsule 0  . diclofenac sodium (VOLTAREN) 1 % GEL Apply 2 g topically 3 (three) times daily.    Marland Kitchen docusate sodium (COLACE) 100 MG capsule Take 100 mg by mouth 2 (two) times daily.    Marland Kitchen enoxaparin (LOVENOX) 40 MG/0.4ML injection Inject 40 mg into the skin daily.    . hydrochlorothiazide (MICROZIDE) 12.5 MG capsule Take 12.5 mg by mouth daily.    . metoprolol tartrate (LOPRESSOR) 25 MG tablet Take 25 mg  by mouth 2 (two) times daily.    . potassium chloride (K-DUR,KLOR-CON) 10 MEQ tablet Take 20 mEq by mouth daily. 37meq am adding 31meq kcl every afternoon for total 60 meq daily    . Potassium Chloride ER 20 MEQ TBCR Take 40 mEq by mouth daily.     . SENNA PO Take 2 tablets by mouth 2 (two) times daily.     Marland Kitchen tiZANidine (ZANAFLEX) 2 MG tablet Take 2 mg by mouth every morning.     No current facility-administered medications on file prior to visit.     Allergies:  No Known Allergies  Physical Exam General: Frail  elderly African-American lady seated, in no evident distress Head: head normocephalic and atraumatic.   Neck: supple with no carotid or supraclavicular bruits Cardiovascular: regular rate and rhythm, no murmurs Musculoskeletal: no deformity Skin:  no rash/petichiae Vascular:  Normal pulses all extremities  Neurologic Exam Mental Status: Awake and fully alert. Oriented to place and time. Recent and remote memory intact. Attention span, concentration and fund of knowledge appropriate. Mood and affect appropriate.  Cranial Nerves: Fundoscopic exam reveals sharp disc margins. Pupils equal, briskly reactive to light. Extraocular movements full without nystagmus. Visual fields show dense left homonymous hemianopsia  to confrontation. Hearing intact. Facial sensation intact.  Moderate left lower facial weakness., tongue, palate moves normally and symmetrically.  Motor: Spastic left hemiplegia with 0/5 strength in the left upper and lower extremity with increased tone and mild spasticity.  Leg greater than arm.  Normal strength on the right side.  There is s Sensory.:  Diminished to touch , pinprick , position and vibratory sensation.  In the left upper and lower extremities. Coordination: Rapid alternating movements normal in the right-sided extremities. Finger-to-nose and heel-to-shin performed accurately on the right only and cannot be tested on the left. Gait and Station: Unable to arise  from the bed and she is bedridden.   Reflexes: 1+ and asymmetric and brisker on the left. Toes downgoing.   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  Vascular risk factors of hypertension hyperlipidemia.     PLAN: I had a long discussion with the patient and family members regarding her post stroke spasticity and chronic left shoulder pain.  I recommend referral to Dr.Kirsteins   for management of chronic poststroke pain and spasticity.  Check MRI scan of the brain pituitary protocol to evaluate for pituitary adenoma.  Continue aspirin for stroke prevention.  Continue ongoing physical, occupational therapy and strict control of hypertension and lipids.  She will return for follow-up in the future as necessary only.I spent 25  minutes in total face-to-face time with the patient, more than 50% of which was spent in counseling and coordination of care, reviewing test results, reviewing medication and discussing or reviewing the diagnosis of  Stroke and spastic hemiplegia  , the prognosis and treatment options.  She will return for follow-up in 2 months or call earlier if necessary. Antony Contras, MD Medical Director [facility] Pager: [phone] 07/18/2018 6:16 PM Note: This document was prepared with digital dictation and possible smart phrase technology. Any transcriptional errors that result from this process are unintentional.

## 2018-07-18 NOTE — Patient Instructions (Addendum)
I had a long discussion with the patient and family members regarding her post stroke spasticity and chronic left shoulder pain.  I recommend referral to Dr.Kirsteins seen 1 patient was seen from: Rehab center for management of chronic poststroke pain and spasticity.  Check MRI scan of the brain pituitary protocol to evaluate for pituitary adenoma.  Continue aspirin for stroke prevention.  Continue ongoing physical occupational therapy and strict control of hypertension and lipids.  She will return for follow-up in the future as necessary only.

## 2018-07-20 ENCOUNTER — Telehealth: Payer: Self-pay

## 2018-07-20 NOTE — Telephone Encounter (Signed)
Rn spoke with Pamala Hurry on 07/19/2018. Rn stated the consult paper work was left. Rn stated it will be fax. Rn was told to fax it to attention Rosine Door scheduler at [phone]. Fax confirmed and receive.

## 2018-07-28 ENCOUNTER — Encounter: Payer: Self-pay | Admitting: Physical Medicine & Rehabilitation

## 2018-07-30 ENCOUNTER — Ambulatory Visit
Admission: RE | Admit: 2018-07-30 | Discharge: 2018-07-30 | Disposition: A | Discharge status: Home / Self Care | Payer: Medicare Other | Source: Ambulatory Visit | Attending: Neurology | Admitting: Neurology

## 2018-07-30 DIAGNOSIS — M25512 Pain in left shoulder: Secondary | ICD-10-CM

## 2018-07-30 DIAGNOSIS — D352 Benign neoplasm of pituitary gland: Secondary | ICD-10-CM

## 2018-07-30 DIAGNOSIS — G8929 Other chronic pain: Secondary | ICD-10-CM

## 2018-07-30 MED ORDER — GADOBENATE DIMEGLUMINE 529 MG/ML IV SOLN
7.0000 mL | Freq: Once | INTRAVENOUS | Status: AC | PRN
  Administered 2018-07-30: 7 mL via INTRAVENOUS

## 2018-07-31 ENCOUNTER — Ambulatory Visit: Payer: Medicare Other | Admitting: Neurology

## 2018-08-01 ENCOUNTER — Other Ambulatory Visit: Payer: Self-pay | Admitting: Neurology

## 2018-08-01 DIAGNOSIS — D352 Benign neoplasm of pituitary gland: Secondary | ICD-10-CM

## 2018-08-02 ENCOUNTER — Telehealth: Payer: Self-pay

## 2018-08-02 NOTE — Telephone Encounter (Signed)
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  The patient is already more than 3 months out from her stroke.  I recommend referral to neurosurgery to consider treatment.  He voiced understanding.  Ambulatory neurosurgery order was placed in epic.

## 2018-08-02 NOTE — Telephone Encounter (Signed)
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 The patient is already more than 3 months out from her stroke. I recommend referral to neurosurgery to consider treatment. He voiced understanding. Ambulatory neurosurgery order was placed in epic.

## 2018-08-31 ENCOUNTER — Encounter: Payer: Medicare Other | Attending: Physical Medicine & Rehabilitation

## 2018-08-31 ENCOUNTER — Encounter: Payer: Self-pay | Admitting: Physical Medicine & Rehabilitation

## 2018-08-31 ENCOUNTER — Ambulatory Visit (HOSPITAL_BASED_OUTPATIENT_CLINIC_OR_DEPARTMENT_OTHER): Payer: Medicare Other | Admitting: Physical Medicine & Rehabilitation

## 2018-08-31 VITALS — BP 108/75 | HR 66 | Resp 14

## 2018-08-31 DIAGNOSIS — G8929 Other chronic pain: Secondary | ICD-10-CM | POA: Insufficient documentation

## 2018-08-31 DIAGNOSIS — M7542 Impingement syndrome of left shoulder: Secondary | ICD-10-CM | POA: Diagnosis not present

## 2018-08-31 DIAGNOSIS — M25512 Pain in left shoulder: Secondary | ICD-10-CM | POA: Diagnosis not present

## 2018-08-31 DIAGNOSIS — D352 Benign neoplasm of pituitary gland: Secondary | ICD-10-CM | POA: Diagnosis present

## 2018-08-31 DIAGNOSIS — G8110 Spastic hemiplegia affecting unspecified side: Secondary | ICD-10-CM

## 2018-08-31 NOTE — Patient Instructions (Signed)
Cortisone injection today Celestone 6mg  and lidocaine 1% x 62ml Left subacromial bursa  Schedule next visit for Botox  To [facility], [facility], [facility], [facility] Biceps

## 2018-08-31 NOTE — Progress Notes (Signed)
Subjective:    Patient ID: Ann Figueroa, female    DOB: 1946-06-01, 73 y.o.   MRN: [id number]  HPI 73 year old African-American lady seen today for initial office consultation visit shoulder pain and muscle spasms after stroke  She was admitted to [facility] in [place] on 02/23/2018 with left-sided weakness. She apparently presented beyond time window for thrombolysis but was found to have a right middle cerebral artery clot for which she underwent mechanical thrombectomy and intra-arterial treatment with 2 mg of TPA but with resultant persistent occlusion of the right parietal M2 branch and hemorrhagic transformation.Marland Kitchen However as per the family the vessel reoccluded shortly thereafter with some hemorrhagic transformation in her right brain infarct with resultant significant left hemiplegia which has persisted.   Pt has been a resident of [facility] since relocating from [place] to [place]  The patient has been receiving physical therapy while at the skilled nursing facility.  According to her daughter the patient ambulates with a hemiwalker and 1 person assistance.  The patient does need assistance to advance the left lower extremity.  The patient complains of left shoulder pain primarily with movement.  The patient spends most of her time in a wheelchair and uses a left arm trough as well as a wedge to elevate the left hand. She does not have any significant left hand pain. She denies any left lower extremity pain. No recent falls or injuries.  Pain Inventory Average Pain 6 Pain Right Now 3 My pain is aching  In the last 24 hours, has pain interfered with the following? General activity 2 Relation with others 0 Enjoyment of life 0 What TIME of day is your pain at its worst? n/a Sleep (in general) Good  Pain is worse with: inactivity and some activites Pain improves with: nothing Relief from Meds: 0  Mobility how many minutes can you walk? 0 ability to  climb steps?  no do you drive?  no use a wheelchair needs help with transfers  Function retired  Neuro/Psych numbness tingling trouble walking spasms  Prior Studies new  Physicians involved in your care new   History reviewed. No pertinent family history. Social History   Socioeconomic History  . Marital status: Married    Spouse name: Not on file  . Number of children: Not on file  . Years of education: Not on file  . Highest education level: Not on file  Occupational History  . Not on file  Social Needs  . Financial resource strain: Not on file  . Food insecurity:    Worry: Not on file    Inability: Not on file  . Transportation needs:    Medical: Not on file    Non-medical: Not on file  Tobacco Use  . Smoking status: Never Smoker  . Smokeless tobacco: Never Used  Substance and Sexual Activity  . Alcohol use: Not Currently  . Drug use: Not Currently  . Sexual activity: Not Currently  Lifestyle  . Physical activity:    Days per week: Not on file    Minutes per session: Not on file  . Stress: Not on file  Relationships  . Social connections:    Talks on phone: Not on file    Gets together: Not on file    Attends religious service: Not on file    Active member of club or organization: Not on file    Attends meetings of clubs or organizations: Not on file    Relationship status: Not on  file  Other Topics Concern  . Not on file  Social History Narrative  . Not on file   History reviewed. No pertinent surgical history. Past Medical History:  Diagnosis Date  . Depression   . HLD (hyperlipidemia)   . Hypertension   . Left hemiparesis ([place])   . Stroke ([place])   . Urinary retention    BP 108/75   Pulse 66   Resp 14   SpO2 97%   Opioid Risk Score:   Fall Risk Score:  `1  Depression screen PHQ 2/9  No flowsheet data found.  Review of Systems  Constitutional: Negative.   HENT: Negative.   Eyes: Negative.   Respiratory: Negative.     Cardiovascular: Negative.   Gastrointestinal: Negative.   Endocrine: Negative.   Genitourinary: Negative.   Musculoskeletal: Positive for gait problem.       Spasms   Skin: Negative.   Allergic/Immunologic: Negative.   Neurological: Positive for tremors.       Tingling  Psychiatric/Behavioral: Negative.   All other systems reviewed and are negative.      Objective:   Physical Exam Vitals signs and nursing note reviewed.  Constitutional:      Appearance: Normal appearance.  HENT:     Head: Normocephalic.     Mouth/Throat:     Mouth: Mucous membranes are moist.  Eyes:     Extraocular Movements: Extraocular movements intact.     Conjunctiva/sclera: Conjunctivae normal.  Neck:     Musculoskeletal: No muscular tenderness.     Comments: The patient has reduced cervical spine range of motion mainly with left lateral bending and turning her head toward the left.  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  No pain with range of motion Cardiovascular:     Rate and Rhythm: Normal rate and regular rhythm.     Heart sounds: Normal heart sounds. No murmur.  Pulmonary:     Effort: Pulmonary effort is normal. No respiratory distress.     Breath sounds: Normal breath sounds. No stridor. No wheezing or rhonchi.  Abdominal:     General: There is no distension.     Palpations: Abdomen is soft.     Tenderness: There is no abdominal tenderness.  Musculoskeletal:        General: Deformity present.     Right lower leg: No edema.     Left lower leg: No edema.     Comments: Patient has inferior subluxation of the left shoulder. Left hand has no edema. Patient does have limitation with left wrist extension due to increased tone.  The patient has pain with abduction of the left arm at 90 degrees No pain with external rotation of the left arm. Left lower extremity has no pain with range of motion with negative straight leg raise  Skin:    General: Skin is warm and dry.  Neurological:      Mental Status: She is alert.     Comments: Motor strength left upper extremity 0 out of 5 deltoid bicep tricep grip left lower extremity to minus at the hip knee extensor synergy 0 at the ankle. Tone is increased MAS 3 left elbow flexors MAS 3/4 left wrist flexors MAS 2/3 left finger flexors MAS 3 left thumb flexor MAS 1 at the left hamstring MAS 0 at the left plantar flexors No evidence of clonus at the knee or at the ankle there are hyperactive reflexes at the left patellar left ankle as well as left elbow  flexors and extensors    Absent sensation to light touch and proprioception in the left upper extremity she does have intact light touch in the left lower extremity       Assessment & Plan:  1.  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  At this point I do not expect any significant neurologic improvement. We discussed the elevated tone and potential for contracture as well as contributing to discomfort. The patient has also received physical therapy for a number of months and has been on tizanidine as an oral agent without significant improvement Botox plan Left Biceps 50U Left FCR 50U Left FPL 50U Left FDS 50U Left FCU 25U Left FDP 25U  2.  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  We discussed that posterior shoulder pain is very common in patients with significant hemiplegia.  Left neglect is also a risk factor. Shoulder injection Left subacromial   Indication:Left Shoulder pain not relieved by medication management and other conservative care.  Informed consent was obtained after describing risks and benefits of the procedure with the patient, this includes bleeding, bruising, infection and medication side effects. The patient wishes to proceed and has given written consent. Patient was placed in a seated position. The Left shoulder was marked and prepped with betadine in the subacromial area. A  25-gauge 1-1/2 inch needle was inserted into the subacromial area. After negative draw back for blood, a solution containing 1 mL of 6 mg per ML betamethasone and 4 mL of 1% lidocaine was injected. A band aid was applied. The patient tolerated the procedure well. Post procedure instructions were given.

## 2018-09-21 ENCOUNTER — Ambulatory Visit: Payer: Medicare Other | Admitting: Physical Medicine & Rehabilitation

## 2018-09-21 ENCOUNTER — Ambulatory Visit: Payer: Medicare Other

## 2018-09-26 ENCOUNTER — Ambulatory Visit: Payer: Medicare Other

## 2018-09-26 ENCOUNTER — Encounter: Payer: Medicare Other | Attending: Physical Medicine & Rehabilitation

## 2018-09-26 ENCOUNTER — Ambulatory Visit: Payer: Medicare Other | Admitting: Physical Medicine & Rehabilitation

## 2018-09-26 ENCOUNTER — Ambulatory Visit (HOSPITAL_BASED_OUTPATIENT_CLINIC_OR_DEPARTMENT_OTHER): Payer: Medicare Other | Admitting: Physical Medicine & Rehabilitation

## 2018-09-26 ENCOUNTER — Encounter: Payer: Self-pay | Admitting: Physical Medicine & Rehabilitation

## 2018-09-26 VITALS — BP 126/84 | HR 60 | Ht 65.0 in | Wt 126.0 lb

## 2018-09-26 DIAGNOSIS — G8110 Spastic hemiplegia affecting unspecified side: Secondary | ICD-10-CM

## 2018-09-26 DIAGNOSIS — D352 Benign neoplasm of pituitary gland: Secondary | ICD-10-CM | POA: Diagnosis present

## 2018-09-26 DIAGNOSIS — G8114 Spastic hemiplegia affecting left nondominant side: Secondary | ICD-10-CM | POA: Insufficient documentation

## 2018-09-26 NOTE — Progress Notes (Signed)
Botox Injection for spasticity using needle EMG guidance  Dilution: 50 Units/ml Indication: Severe spasticity which interferes with ADL,mobility and/or  hygiene and is unresponsive to medication management and other conservative care Informed consent was obtained after describing risks and benefits of the procedure with the patient. This includes bleeding, bruising, infection, excessive weakness, or medication side effects. A REMS form is on file and signed. Needle: 27g 1" needle electrode Number of units per muscle Left Biceps 50U Left pectoralis 50U Left FCR 50U Left FPL 25U Left FDS 25U Left FCU 50U Left FDP 50U  All injections were done after obtaining appropriate EMG activity and after negative drawback for blood. The patient tolerated the procedure well. Post procedure instructions were given. A followup appointment was made.

## 2018-09-26 NOTE — Patient Instructions (Signed)

## 2018-11-07 ENCOUNTER — Ambulatory Visit: Payer: Medicare Other | Admitting: Physical Medicine & Rehabilitation

## 2018-12-07 ENCOUNTER — Other Ambulatory Visit: Payer: Self-pay

## 2018-12-07 ENCOUNTER — Encounter: Payer: Self-pay | Admitting: Physical Medicine & Rehabilitation

## 2018-12-07 ENCOUNTER — Encounter: Payer: Medicare Other | Attending: Physical Medicine & Rehabilitation

## 2018-12-07 ENCOUNTER — Ambulatory Visit (HOSPITAL_BASED_OUTPATIENT_CLINIC_OR_DEPARTMENT_OTHER): Payer: Medicare Other | Admitting: Physical Medicine & Rehabilitation

## 2018-12-07 VITALS — BP 118/75 | Temp 96.0°F | Ht 66.0 in | Wt 129.0 lb

## 2018-12-07 DIAGNOSIS — M7542 Impingement syndrome of left shoulder: Secondary | ICD-10-CM | POA: Diagnosis not present

## 2018-12-07 DIAGNOSIS — G8110 Spastic hemiplegia affecting unspecified side: Secondary | ICD-10-CM | POA: Diagnosis not present

## 2018-12-07 DIAGNOSIS — G8114 Spastic hemiplegia affecting left nondominant side: Secondary | ICD-10-CM | POA: Insufficient documentation

## 2018-12-07 NOTE — Progress Notes (Signed)
Subjective:    Patient ID: Ann Figueroa, female    DOB: 1946/02/05, 73 y.o.   MRN: [id number] 73 year old African-American lady seen today for initial office consultation visit for stroke. She is accompanied by her daughter. History is obtained from them as well as review of provided medical records and I also spoke to her husband over the phone. She was admitted to [facility] in [place] on 02/23/2018 with left-sided weakness. She apparently presented beyond time window for thrombolysis but was found to have a right middle cerebral artery clot for which she underwent mechanical thrombectomy and intra-arterial treatment with 2 mg of TPA but with resultant persistent occlusion of the right parietal M2 branch and hemorrhagic transformation.Marland Kitchen However as per the family the vessel reoccluded shortly thereafter with some hemorrhagic transformation in her right brain infarct with resultant significant left hemiplegia which has persisted.   WebEx visit with audio and video.  Patient consents to this visit. HPI CC :  Left shoulder pain  73 yo female, with Right MCA infarct onset approximately 9 months ago.  She has had persistent left hemiparesis, cognitive deficits and left neglect.  Interval medical history was admitted to [facility] with seizures which were believed to be related to her stroke.  Repeat MRI showed some areas of positive diffusion weighting within the prior infarct territory.  Patient has follow-up with her vascular neurologist Dr. Leonie Man.  The patient has been receiving home health therapy services even prior to her more recent hospitalization and continues with home health PT OT and speech.  Her left shoulder improved after corticosteroid injection performed in January 2020.  It feels like it may be wearing off.  She has had no new trauma to the area no falls.  Patient feels like her left upper extremity is still doing well after the botulinum toxin injection  approximately 2-1/2 months ago.  The patient's daughter indicates that physical therapy is recommending botulinum toxin injection to the left hamstring because of severe spasms in that area.  09/26/18 Botox Left Biceps 50U Left pectoralis 50U Left FCR 50U Left FPL 25U Left FDS 25U Left FCU 50U Left FDP 50U Pain Inventory Average Pain 0 Pain Right Now 0 My pain is na  In the last 24 hours, has pain interfered with the following? General activity na Relation with others na Enjoyment of life na What TIME of day is your pain at its worst? na Sleep (in general) Good  Pain is worse with: na Pain improves with: na Relief from Meds: na  Mobility walk with assistance use a walker ability to climb steps?  no do you drive?  no use a wheelchair needs help with transfers  Function retired  Neuro/Psych trouble walking spasms  Prior Studies Any changes since last visit?  no  Physicians involved in your care Any changes since last visit?  no   No family history on file. Social History   Socioeconomic History  . Marital status: Married    Spouse name: Not on file  . Number of children: Not on file  . Years of education: Not on file  . Highest education level: Not on file  Occupational History  . Not on file  Social Needs  . Financial resource strain: Not on file  . Food insecurity:    Worry: Not on file    Inability: Not on file  . Transportation needs:    Medical: Not on file    Non-medical: Not on file  Tobacco Use  .  Smoking status: Never Smoker  . Smokeless tobacco: Never Used  Substance and Sexual Activity  . Alcohol use: Not Currently  . Drug use: Not Currently  . Sexual activity: Not Currently  Lifestyle  . Physical activity:    Days per week: Not on file    Minutes per session: Not on file  . Stress: Not on file  Relationships  . Social connections:    Talks on phone: Not on file    Gets together: Not on file    Attends religious service: Not  on file    Active member of club or organization: Not on file    Attends meetings of clubs or organizations: Not on file    Relationship status: Not on file  Other Topics Concern  . Not on file  Social History Narrative  . Not on file   No past surgical history on file. Past Medical History:  Diagnosis Date  . Depression   . HLD (hyperlipidemia)   . Hypertension   . Left hemiparesis ([place])   . Stroke ([place])   . Urinary retention    There were no vitals taken for this visit.  Opioid Risk Score:   Fall Risk Score:  `1  Depression screen PHQ 2/9  No flowsheet data found.   Review of Systems  Constitutional: Negative.   HENT: Negative.   Eyes: Negative.   Respiratory: Negative.   Cardiovascular: Negative.   Gastrointestinal: Negative.   Endocrine: Negative.   Genitourinary: Negative.   Musculoskeletal: Positive for gait problem.  Skin: Negative.   Allergic/Immunologic: Negative.   Hematological: Negative.   Psychiatric/Behavioral: Negative.   All other systems reviewed and are negative.      Objective:   Physical Exam Nursing note reviewed.  HENT:     Head: Normocephalic and atraumatic.  Pulmonary:     Effort: No respiratory distress.     Breath sounds: No stridor.  Neurological:     Mental Status: She is alert.     Comments: Patient is oriented to person and place No evidence dysarthria She does have a long latency of response No evidence of aphasia.  She is unable to lift her left upper extremity against gravity.  Remainder of examination deferred secondary to WebEx visit with audio and video  Psychiatric:        Mood and Affect: Mood normal.           Assessment & Plan:  1.  Right MCA distribution infarct with left spastic hemiplegia.  She is currently receiving home health PT and OT as well as speech visits.  She does have cognitive deficits related to her stroke as well. Her spasticity has improved after botulinum toxin injection will plan to  repeat but add 100 units for the left hamstring, will schedule this in approximately 1 more month  2.  Posterior shoulder pain.  Patient did get good relief with corticosteroid injection.  We will continue home health OT and PT.  We will repeat injection in approximately 2 weeks  COVID risk of complication score 3 Duration of visit is 19 minutes

## 2018-12-21 ENCOUNTER — Ambulatory Visit: Payer: Medicare Other | Admitting: Physical Medicine & Rehabilitation

## 2019-01-12 ENCOUNTER — Encounter: Payer: Medicare Other | Attending: Physical Medicine & Rehabilitation | Admitting: Physical Medicine & Rehabilitation

## 2019-01-12 ENCOUNTER — Other Ambulatory Visit: Payer: Self-pay

## 2019-01-12 VITALS — BP 134/86 | HR 62 | Temp 98.8°F | Ht 65.0 in | Wt 125.0 lb

## 2019-01-12 DIAGNOSIS — D352 Benign neoplasm of pituitary gland: Secondary | ICD-10-CM | POA: Diagnosis present

## 2019-01-12 DIAGNOSIS — M7542 Impingement syndrome of left shoulder: Secondary | ICD-10-CM

## 2019-01-12 DIAGNOSIS — G8114 Spastic hemiplegia affecting left nondominant side: Secondary | ICD-10-CM | POA: Insufficient documentation

## 2019-01-12 NOTE — Progress Notes (Signed)
Shoulder injection Left  Subacromial   Indication:Left Shoulder pain not relieved by medication management and other conservative care.  Informed consent was obtained after describing risks and benefits of the procedure with the patient, this includes bleeding, bruising, infection and medication side effects. The patient wishes to proceed and has given written consent. Patient was placed in a seated position. TheLeft shoulder was marked and prepped with betadine in the subacromial area. A 25-gauge 1-1/2 inch needle was inserted into the subacromial area. After negative draw back for blood, a solution containing 1 mL of 6 mg per ML betamethasone and 4 mL of 1% lidocaine was injected. A band aid was applied. The patient tolerated the procedure well. Post procedure instructions were given.

## 2019-01-12 NOTE — Patient Instructions (Signed)
Cortisone and lidocaine injected numbing  medicine will wear off in a couple hours may need to ice for 16min if sore

## 2019-01-29 ENCOUNTER — Encounter: Payer: Medicare Other | Attending: Physical Medicine & Rehabilitation | Admitting: Physical Medicine & Rehabilitation

## 2019-01-29 DIAGNOSIS — G8114 Spastic hemiplegia affecting left nondominant side: Secondary | ICD-10-CM | POA: Insufficient documentation

## 2020-10-23 IMAGING — MR MR HEAD W/O CM
10 of 11 series · 42 of 48 positions shown · non-contrast
Comparison: 07/06/2018 CT angiogram of the head.

CLINICAL DATA: 72 y/o F; syncopal episode this afternoon. History
of right MCA hemorrhagic stroke.

EXAM:
MRI HEAD WITHOUT CONTRAST
TECHNIQUE: Multiplanar, multiecho pulse sequences of the brain and surrounding
structures were obtained without intravenous contrast.

[Series 5: DWI · axial · 4.0mm · 0.88mm/px · z∈[-71,+68]mm · 8 of 74 slices shown (1 of 4)]
[im 1/74]
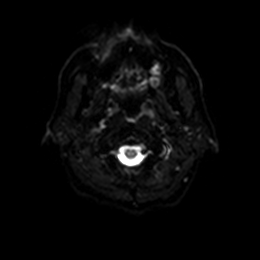
[im 11/74]
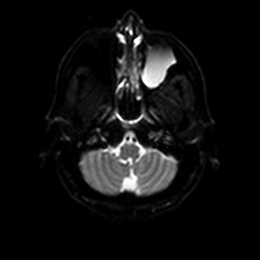
[im 21/74]
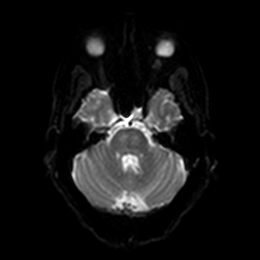
[im 32/74]
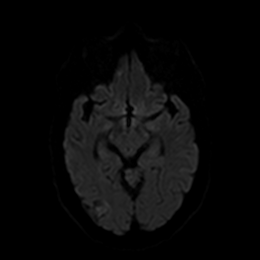
[im 42/74]
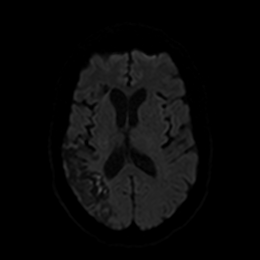
[im 53/74]
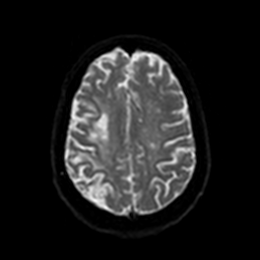
[im 63/74]
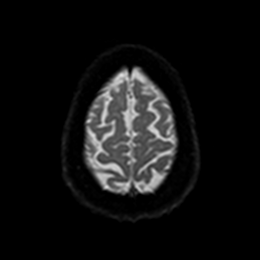
[im 74/74]
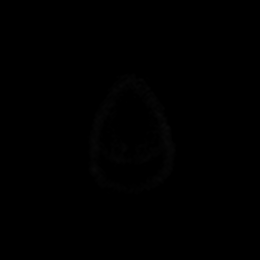

[Series 6: DWI · axial · 4.0mm · 0.88mm/px · z∈[-71,+68]mm · 4 of 37 slices shown (2 of 4)]
[im 1/37]
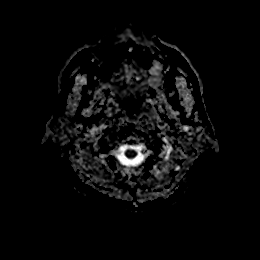
[im 13/37]
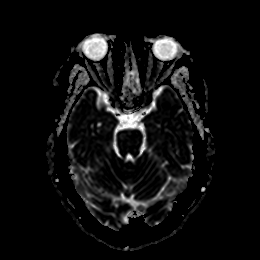
[im 25/37]
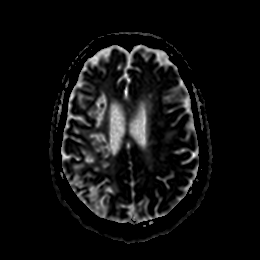
[im 37/37]
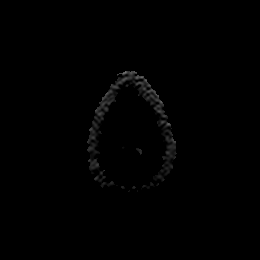

[Series 7: DWI · coronal · 4.0mm · 0.88mm/px · 7 of 68 slices shown (3 of 4)]
[im 1/68]
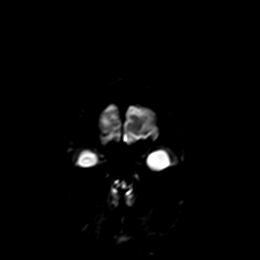
[im 12/68]
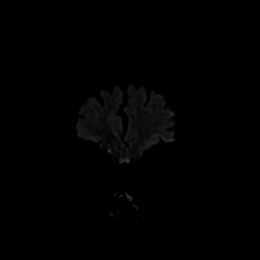
[im 23/68]
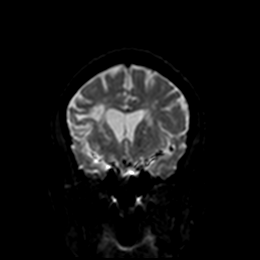
[im 34/68]
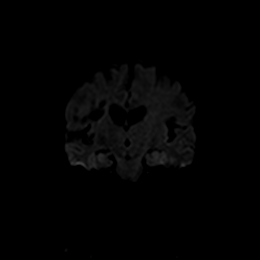
[im 45/68]
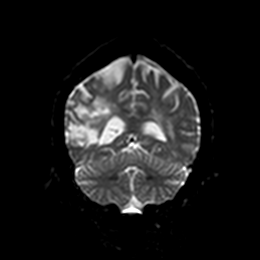
[im 56/68]
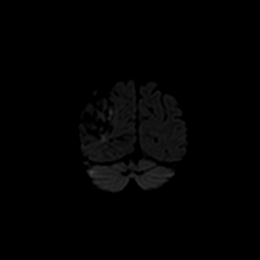
[im 68/68]
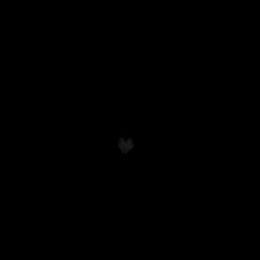

[Series 8: DWI · coronal · 4.0mm · 0.88mm/px · 3 of 34 slices shown (4 of 4)]
[im 1/34]
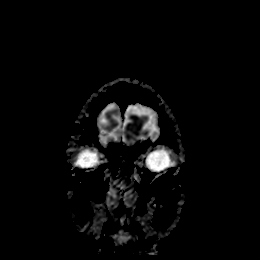
[im 17/34]
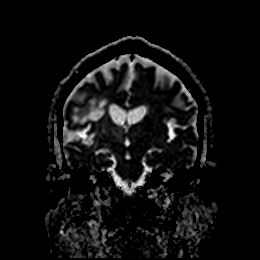
[im 34/34]
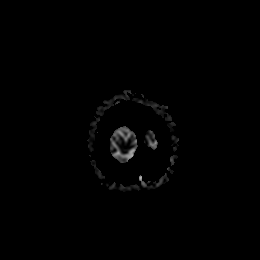

[Series 9: T1 · sagittal · 5.0mm · 0.75mm/px · 2 of 23 slices shown]
[im 1/23]
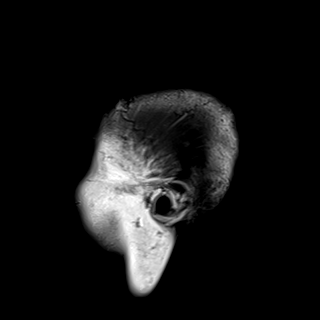
[im 23/23]
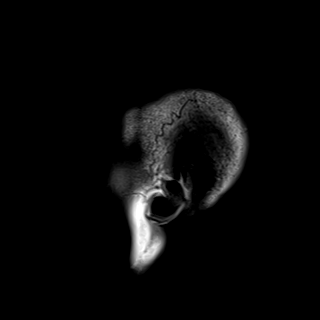

[Series 10: T2 · axial · 5.0mm · 0.72mm/px · z∈[-72,+68]mm · 2 of 25 slices shown (1 of 2)]
[im 1/25]
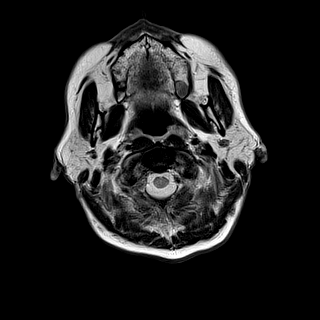
[im 25/25]
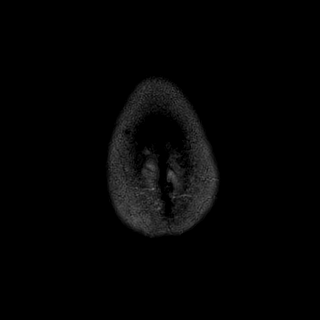

[Series 11: FLAIR · axial · 5.0mm · 0.45mm/px · z∈[-71,+69]mm · 2 of 25 slices shown]
[im 1/25]
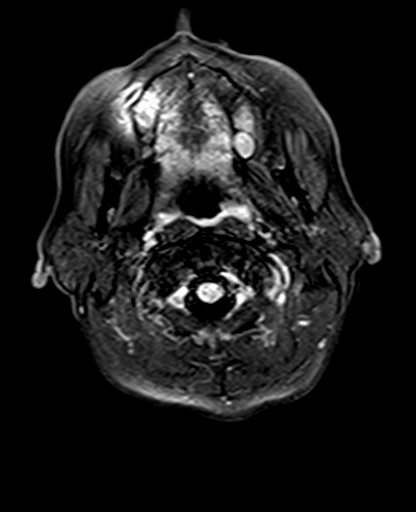
[im 25/25]
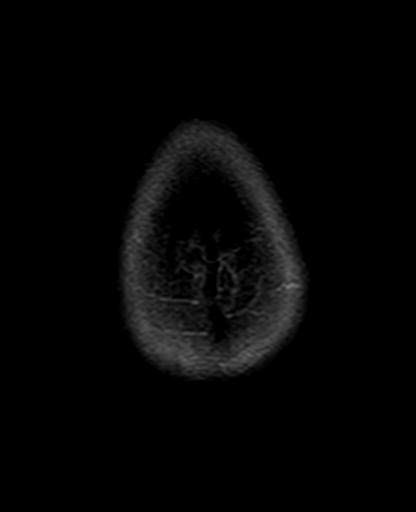

[Series 12: swi_images · axial · 3.0mm · 0.90mm/px · z∈[-80,+92]mm · 6 of 60 slices shown]
[im 1/60]
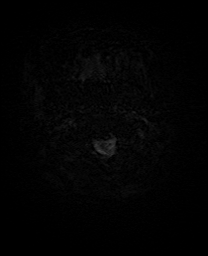
[im 12/60]
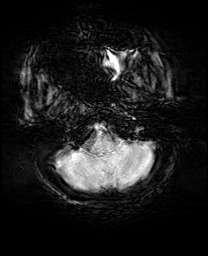
[im 24/60]
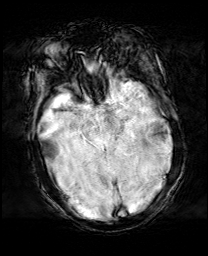
[im 36/60]
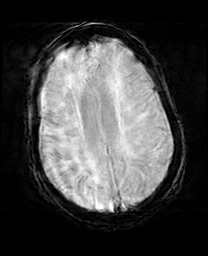
[im 48/60]
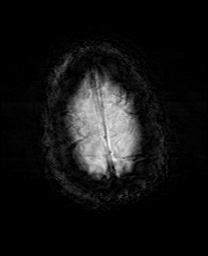
[im 60/60]
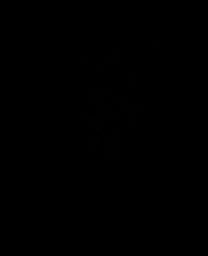

[Series 13: mip_images(sw) · axial · 24.0mm · 0.90mm/px · z∈[-70,+82]mm · 5 of 53 slices shown]
[im 1/53]
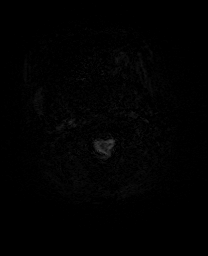
[im 14/53]
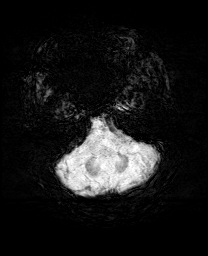
[im 27/53]
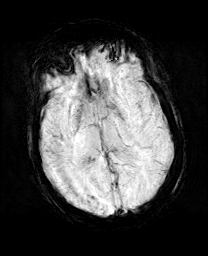
[im 40/53]
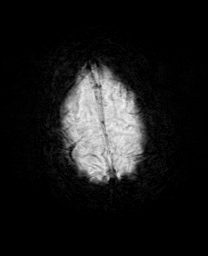
[im 53/53]
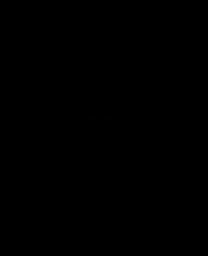

[Series 15: T2 · coronal · 5.0mm · 0.72mm/px · 3 of 28 slices shown (2 of 2)]
[im 1/28]
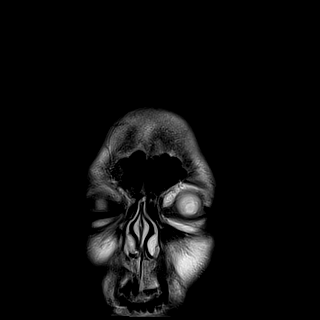
[im 14/28]
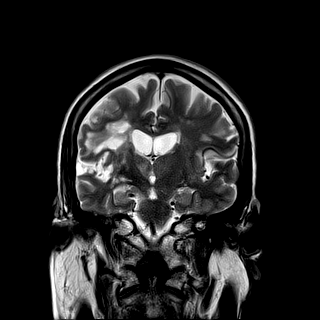
[im 28/28]
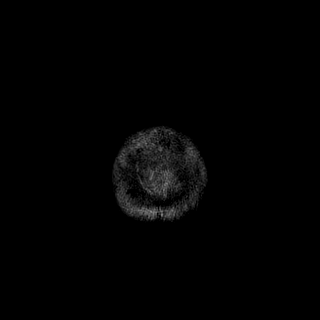

[42 of 48 positions shown; findings below may reference images not displayed]

FINDINGS: Brain: Pituitary mass measuring 12 mm craniocaudal (series 9, image
12). Large right MCA distribution chronic infarction with
hemosiderin staining and wallerian degeneration into the right basal
ganglia and brainstem. No reduced diffusion to suggest acute or
early subacute infarction. Multiple nonspecific T2 FLAIR
hyperintensities in subcortical and periventricular white matter are
compatible with a background of moderate chronic microvascular
ischemic changes for age. Moderate volume loss of the brain. Small
chronic infarction in the right thalamus.

Vascular: Normal flow voids.

Skull and upper cervical spine: Normal marrow signal.

Sinuses/Orbits: Large left maxillary sinus mucous retention cyst.
Additional visible paranasal sinuses and mastoid air cells
demonstrate normal signal. Bilateral intra-ocular lens replacement.

Other: None.
IMPRESSION: 1. No acute intracranial abnormality identified.
2. 12 mm pituitary mass, likely macroadenoma. Pituitary protocol MRI
is recommended on a nonemergent basis.
3. Large chronic right MCA distribution hemorrhagic infarct. Small
chronic lacunar infarct in right thalamus. Moderate chronic
microvascular ischemic changes and volume loss of the brain.
